# Patient Record
Sex: Male | Born: 1950 | Race: Black or African American | Hispanic: No | Smoking: Never smoker
Health system: Southern US, Community
[De-identification: ages and names within clinical notes are randomized; demographics above are authoritative.]

## PROBLEM LIST (undated history)

## (undated) DIAGNOSIS — I2699 Other pulmonary embolism without acute cor pulmonale: Secondary | ICD-10-CM

## (undated) DIAGNOSIS — M109 Gout, unspecified: Secondary | ICD-10-CM

## (undated) DIAGNOSIS — I82409 Acute embolism and thrombosis of unspecified deep veins of unspecified lower extremity: Secondary | ICD-10-CM

## (undated) DIAGNOSIS — R569 Unspecified convulsions: Secondary | ICD-10-CM

## (undated) HISTORY — PX: NO PAST SURGERIES: SHX2092

---

## 2017-01-27 ENCOUNTER — Encounter (HOSPITAL_COMMUNITY): Payer: Self-pay | Admitting: *Deleted

## 2017-01-27 ENCOUNTER — Inpatient Hospital Stay (HOSPITAL_COMMUNITY)
Admission: EM | Admit: 2017-01-27 | Discharge: 2017-01-29 | DRG: 552 | Disposition: A | Discharge status: Home / Self Care | Payer: Medicare Other | Source: Other Acute Inpatient Hospital | Attending: Neurology | Admitting: Neurology

## 2017-01-27 DIAGNOSIS — G40909 Epilepsy, unspecified, not intractable, without status epilepticus: Secondary | ICD-10-CM | POA: Diagnosis not present

## 2017-01-27 DIAGNOSIS — R4702 Dysphasia: Secondary | ICD-10-CM | POA: Diagnosis present

## 2017-01-27 DIAGNOSIS — M50122 Cervical disc disorder at C5-C6 level with radiculopathy: Principal | ICD-10-CM | POA: Diagnosis present

## 2017-01-27 DIAGNOSIS — Z86711 Personal history of pulmonary embolism: Secondary | ICD-10-CM

## 2017-01-27 DIAGNOSIS — E669 Obesity, unspecified: Secondary | ICD-10-CM | POA: Diagnosis present

## 2017-01-27 DIAGNOSIS — Z86718 Personal history of other venous thrombosis and embolism: Secondary | ICD-10-CM

## 2017-01-27 DIAGNOSIS — G40409 Other generalized epilepsy and epileptic syndromes, not intractable, without status epilepticus: Secondary | ICD-10-CM | POA: Diagnosis present

## 2017-01-27 DIAGNOSIS — R4701 Aphasia: Secondary | ICD-10-CM | POA: Diagnosis present

## 2017-01-27 DIAGNOSIS — M109 Gout, unspecified: Secondary | ICD-10-CM | POA: Diagnosis present

## 2017-01-27 DIAGNOSIS — Z9282 Status post administration of tPA (rtPA) in a different facility within the last 24 hours prior to admission to current facility: Secondary | ICD-10-CM

## 2017-01-27 DIAGNOSIS — M5412 Radiculopathy, cervical region: Secondary | ICD-10-CM | POA: Diagnosis not present

## 2017-01-27 DIAGNOSIS — I639 Cerebral infarction, unspecified: Secondary | ICD-10-CM | POA: Diagnosis not present

## 2017-01-27 DIAGNOSIS — R51 Headache: Secondary | ICD-10-CM | POA: Diagnosis present

## 2017-01-27 DIAGNOSIS — Z6831 Body mass index (BMI) 31.0-31.9, adult: Secondary | ICD-10-CM | POA: Diagnosis not present

## 2017-01-27 DIAGNOSIS — I5032 Chronic diastolic (congestive) heart failure: Secondary | ICD-10-CM | POA: Diagnosis not present

## 2017-01-27 HISTORY — DX: Unspecified convulsions: R56.9

## 2017-01-27 HISTORY — DX: Other pulmonary embolism without acute cor pulmonale: I26.99

## 2017-01-27 HISTORY — DX: Acute embolism and thrombosis of unspecified deep veins of unspecified lower extremity: I82.409

## 2017-01-27 HISTORY — DX: Gout, unspecified: M10.9

## 2017-01-27 LAB — MRSA PCR SCREENING: MRSA BY PCR: NEGATIVE

## 2017-01-27 MED ORDER — STROKE: EARLY STAGES OF RECOVERY BOOK
Freq: Once | Status: AC
  Administered 2017-01-27: 22:00:00
  Filled 2017-01-27: qty 1

## 2017-01-27 MED ORDER — ACETAMINOPHEN 325 MG PO TABS: 650.0000 mg | ORAL_TABLET | ORAL | Status: DC | PRN

## 2017-01-27 MED ORDER — PANTOPRAZOLE SODIUM 40 MG IV SOLR
40.0000 mg | Freq: Every day | INTRAVENOUS | Status: DC
  Administered 2017-01-27: 40 mg via INTRAVENOUS
  Filled 2017-01-27: qty 40

## 2017-01-27 MED ORDER — SODIUM CHLORIDE 0.9 % IV SOLN
INTRAVENOUS | Status: DC
  Administered 2017-01-27 – 2017-01-28 (×2): via INTRAVENOUS

## 2017-01-27 MED ORDER — LEVETIRACETAM 750 MG PO TABS
750.0000 mg | ORAL_TABLET | Freq: Every day | ORAL | Status: DC
  Administered 2017-01-27 – 2017-01-28 (×2): 750 mg via ORAL
  Filled 2017-01-27 (×2): qty 1

## 2017-01-27 MED ORDER — SENNOSIDES-DOCUSATE SODIUM 8.6-50 MG PO TABS: 1.0000 | ORAL_TABLET | Freq: Every evening | ORAL | Status: DC | PRN

## 2017-01-27 MED ORDER — CLEVIDIPINE BUTYRATE 0.5 MG/ML IV EMUL: 0.0000 mg/h | INTRAVENOUS | Status: DC

## 2017-01-27 MED ORDER — ACETAMINOPHEN 160 MG/5ML PO SOLN: 650.0000 mg | ORAL | Status: DC | PRN

## 2017-01-27 MED ORDER — LABETALOL HCL 5 MG/ML IV SOLN: 20.0000 mg | Freq: Once | INTRAVENOUS | Status: DC

## 2017-01-27 MED ORDER — ACETAMINOPHEN 650 MG RE SUPP: 650.0000 mg | RECTAL | Status: DC | PRN

## 2017-01-27 NOTE — H&P (Signed)
Admission H&P    Chief Complaint: Acute ischemic stroke, s/p IV tPA  HPI: Erik Peterson is an 66 y.o. male who was LKN at 1:45 PM today while eating lunch with a friend. He suddenly developed a cramping and mildly painful sensation in his left forearm that migrated up the medial aspect of his LUE to his shoulder and then to the left pectoral region. The abnormal sensation was accompanied by some weakness. Migration of the abnormal sensation continued across his chest to his right shoulder. In addition to the above, he experienced sudden onset of dysphasia, with inability to "get the words out". He also had a left sided headache at the time.   He was emergently evaluated by a Teleneurology consultant at [facility]. CT head was negative for hemorrhage. CTA showed no LVO. He was administered IV tPA and sent to [facility] for further management. Prior to transfer a CTA of chest was obtained for his chest pain, which ruled out a dissection.  The patient states that he has a history of seizures, but that the above episode was not a seizure. He states that he has "petit mal" seizures which occur only at night, about 15 times per year. He takes Keppra 750 mg qhs for this. He also takes ASA 81 mg daily. He has a history of gout and is prescribed colchicine for flares.   EKG here at [facility] shows sinus bradycardia and is otherwise normal.   LSN: Per outside hospital documentation tPA Given: Yes, at outside hospital  No past medical history on file.  No past surgical history on file.  No family history on file. Social History:  has no tobacco, alcohol, and drug history on file.  Allergies: Allergies not on file  Home Medications: Keppra 750 mg qhs ASA 81 mg qd Colchicine PRN  ROS: No chest pain, abdominal pain or lower extremity pain. States all neurological symptoms have completely resolved following tPA. Other ROS as per HPI.   Physical Examination: Blood pressure 116/88, pulse 62, temperature 98.2  F (36.8 C), resp. rate 14, height 5\' 7"  (1.702 m), weight 89.9 kg (198 lb 3.1 oz), SpO2 99 %.  HEENT-  [place]/AT  Lungs - Respirations unlabored Extremities - No edema  Neurologic Examination: Mental Status: Alert, oriented, thought content appropriate.  Speech fluent with intact naming, repetition and comprehension.  Able to follow 3 step commands without difficulty. Cranial Nerves: II:  Visual fields intact. PERRL.  III,IV, VI: ptosis not present, EOMI without nystagmus V,VII: smile symmetric, facial temp sensation normal bilaterally VIII: hearing intact to conversation IX,X: no hypophonia XI: symmetric XII: midline tongue extension  Motor: Right : Upper extremity   5/5    Left:     Upper extremity   5/5  Lower extremity   5/5     Lower extremity   5/5 Normal tone throughout; no atrophy noted Sensory: Temperature and light touch intact x 4. No extinction. Deep Tendon Reflexes:  1+ bilateral upper and lower extremities. Toes downgoing bilaterally.  Cerebellar: No ataxia with FNF bilaterally.  Gait: Deferred  No results found for this or any previous visit (from the past 48 hour(s)). No results found.  Assessment: 66 y.o. male with acute onset of expressive aphasia and LUE sensory symptoms 1. LUE sensory and motor symptoms are referable to the right MCA territory; however, the patient is right handed, which would make right hemisphere speech dominance unlikely and therefore calls into question the above localization with regard to his aphasia. Although stroke  was the working diagnosis at the outside hospital, a seizure or stress-related episode should also be considered.  2. Also presented with chest pain at OSH. CTA chest showed no dissection. EKG here shows no evidence for MI.  3. CTA at OSH showed no LVO.  4. Seizure disorder. On Keppra as an outpatient.   Plan: 1. HgbA1c, fasting lipid panel 2. MRI of the brain without contrast 3. PT consult, OT consult, Speech consult 4.  Echocardiogram 5. Carotid dopplers 6. No antiplatelet medications or anticoagulants for at least 24 hours following tPA. Can consider restarting ASA at a higher dose, versus switching to Plavix, if repeat CT head at 24 hours is negative for hemorrhage.  7. DVT prophylaxis with SCDs.  8. BP management as per post-tPA order set. 9. Consider starting a statin.  10. Telemetry monitoring   Electronically signed: Dr. Caryl PinaEric Danielly Peterson 01/27/2017, 7:43 PM

## 2017-01-28 ENCOUNTER — Inpatient Hospital Stay (HOSPITAL_COMMUNITY): Payer: Medicare Other

## 2017-01-28 ENCOUNTER — Encounter (HOSPITAL_COMMUNITY): Payer: Self-pay

## 2017-01-28 DIAGNOSIS — M5412 Radiculopathy, cervical region: Secondary | ICD-10-CM

## 2017-01-28 DIAGNOSIS — I5032 Chronic diastolic (congestive) heart failure: Secondary | ICD-10-CM

## 2017-01-28 DIAGNOSIS — G40909 Epilepsy, unspecified, not intractable, without status epilepticus: Secondary | ICD-10-CM

## 2017-01-28 LAB — ECHOCARDIOGRAM COMPLETE
AOASC: 34 cm
CHL CUP MV DEC (S): 190
E decel time: 190 msec
FS: 26 % — AB (ref 28–44)
Height: 67 in
IVS/LV PW RATIO, ED: 1.18
LA ID, A-P, ES: 37 mm
LA vol index: 20.7 mL/m2
LADIAMINDEX: 1.77 cm/m2
LAVOL: 43.3 mL
LAVOLA4C: 37.8 mL
LDCA: 3.46 cm2
LEFT ATRIUM END SYS DIAM: 37 mm
LV TDI E'MEDIAL: 7.62
LVELAT: 8.05 cm/s
LVOTD: 21 mm
Lateral S' vel: 11.1 cm/s
MV pk E vel: 1.2 m/s
PW: 11 mm — AB (ref 0.6–1.1)
TAPSE: 19.8 mm
TDI e' lateral: 8.05
Weight: 3171.1 oz

## 2017-01-28 LAB — CBC
HCT: 41.6 % (ref 39.0–52.0)
Hemoglobin: 13.6 g/dL (ref 13.0–17.0)
MCH: 30.6 pg (ref 26.0–34.0)
MCHC: 32.7 g/dL (ref 30.0–36.0)
MCV: 93.7 fL (ref 78.0–100.0)
PLATELETS: 154 10*3/uL (ref 150–400)
RBC: 4.44 MIL/uL (ref 4.22–5.81)
RDW: 13.3 % (ref 11.5–15.5)
WBC: 6.2 10*3/uL (ref 4.0–10.5)

## 2017-01-28 LAB — LIPID PANEL
CHOLESTEROL: 124 mg/dL (ref 0–200)
HDL: 41 mg/dL (ref >40)
LDL CALC: 77 mg/dL (ref 0–99)
TRIGLYCERIDES: 28 mg/dL (ref <150)
Total CHOL/HDL Ratio: 3 RATIO
VLDL: 6 mg/dL (ref 0–40)

## 2017-01-28 LAB — BASIC METABOLIC PANEL
Anion gap: 6 (ref 5–15)
BUN: 10 mg/dL (ref 6–20)
CALCIUM: 8.8 mg/dL — AB (ref 8.9–10.3)
CO2: 25 mmol/L (ref 22–32)
CREATININE: 1.04 mg/dL (ref 0.61–1.24)
Chloride: 109 mmol/L (ref 101–111)
Glucose, Bld: 112 mg/dL — ABNORMAL HIGH (ref 65–99)
Potassium: 4 mmol/L (ref 3.5–5.1)
Sodium: 140 mmol/L (ref 135–145)

## 2017-01-28 MED ORDER — PANTOPRAZOLE SODIUM 40 MG PO TBEC
40.0000 mg | DELAYED_RELEASE_TABLET | Freq: Every day | ORAL | Status: DC
  Filled 2017-01-28: qty 1

## 2017-01-28 MED ORDER — ASPIRIN EC 81 MG PO TBEC
81.0000 mg | DELAYED_RELEASE_TABLET | Freq: Every day | ORAL | Status: DC
  Administered 2017-01-28: 81 mg via ORAL
  Filled 2017-01-28: qty 1

## 2017-01-28 NOTE — Progress Notes (Signed)
STROKE TEAM PROGRESS NOTE   HISTORY OF PRESENT ILLNESS (per record)  Erik Peterson is an 66 y.o. male who was LKN at 1:45 PM today while eating lunch with a friend. He suddenly developed a cramping and mildly painful sensation in his left forearm that migrated up the medial aspect of his LUE to his shoulder and then to the left pectoral region. The abnormal sensation was accompanied by some weakness. Migration of the abnormal sensation continued across his chest to his right shoulder. In addition to the above, he experienced sudden onset of dysphasia, with inability to "get the words out". He also had a left sided headache at the time.   He was emergently evaluated by a Teleneurology consultant at [facility] CT head was negative for hemorrhage. CTA showed no LVO. He was administered IV tPA and [facility] management. Prior to transfer a CTA of chest was obtained for his chest pain, which ruled out a dissection.  The patient states that he has a history of seizures, but that the above episode was not a seizure. He states that he has "petit mal" seizures which occur only at night, about 15 times per year. He takes Keppra 750 mg qhs for this. He also takes ASA 81 mg daily. He has a history of gout and is prescribed colchicine for flares.   [facility] shows sinus bradycardia and is otherwise normal.   LSN: Per outside hospital documentation tPA Given: Yes, at outside hospital    SUBJECTIVE (INTERVAL HISTORY) No acute events overnight. Pt says that he has been having cramping of the arms for 2 months- more on the elft and the pain would last a long time, and continuous in nature. Yesterday he had left arm pain and it was more severe and radiated to his shoulder and face and then left chest and then right forearm at the time he was eating lunch. Then, he had word finding difficulty as he knew what he wanted to say but was not able to express it. He denies any history of  alcohol use or smoking. Received tPA at OSH and speech gradually improved. Currently, no speech problem and no cramping of arms. Admitted that he had intermittent neck discomfort and he had worked under the sink the day before yesterday and had stretched his neck a lot during the work.   OBJECTIVE Temp:  [97.7 F (36.5 C)-98.2 F (36.8 C)] 97.8 F (36.6 C) (08/15 1200) Pulse Rate:  [46-75] 52 (08/15 1500) Cardiac Rhythm: Sinus bradycardia (08/15 0800) Resp:  [12-24] 12 (08/15 1500) BP: (103-142)/(64-94) 122/80 (08/15 1500) SpO2:  [96 %-100 %] 100 % (08/15 1500) Weight:  [198 lb 3.1 oz (89.9 kg)] 198 lb 3.1 oz (89.9 kg) (08/14 1847)  CBC:   Recent Labs Lab 01/28/17 0730  WBC 6.2  HGB 13.6  HCT 41.6  MCV 93.7  PLT 154    Basic Metabolic Panel:   Recent Labs Lab 01/28/17 0730  NA 140  K 4.0  CL 109  CO2 25  GLUCOSE 112*  BUN 10  CREATININE 1.04  CALCIUM 8.8*    Lipid Panel:     Component Value Date/Time   CHOL 124 01/28/2017 0339   TRIG 28 01/28/2017 0339   HDL 41 01/28/2017 0339   CHOLHDL 3.0 01/28/2017 0339   VLDL 6 01/28/2017 0339   LDLCALC 77 01/28/2017 0339   HgbA1c: No results found for: HGBA1C Urine Drug Screen: No results found for: LABOPIA, COCAINSCRNUR,  LABBENZ, AMPHETMU, THCU, LABBARB  Alcohol Level No results found for: [facility]  IMAGING I have personally reviewed the radiological images below and agree with the radiology interpretations.  CTA head and neck  1. no emergent large vessel occlusion or high-grade stenosis. 2. normal internal carotid and vertebral arteries 3. normal appearance of the thoracic aorta to the level of the right pulmonary artery  CT head 1.No hemorrhage or mass lesion 2. findings of chronic microvascular ischemia 3. aspects is 10 4. unchanged in appearance of multifocal hyperdense material in the posterior skull this may indicate multiple sebaceous cysts or other chronic subcutaneous processes   MRI brain  1. No acute  intracranial abnormality. No MR evidence of recent seizure. 2. Moderate to severe sequelae of chronic microvascular ischemia.   MRI C-spine pending  TTE pending   PHYSICAL EXAM  Temp:  [97.7 F (36.5 C)-98.2 F (36.8 C)] 97.8 F (36.6 C) (08/15 1200) Pulse Rate:  [46-75] 52 (08/15 1500) Resp:  [12-24] 12 (08/15 1500) BP: (103-142)/(64-94) 122/80 (08/15 1500) SpO2:  [96 %-100 %] 100 % (08/15 1500) Weight:  [198 lb 3.1 oz (89.9 kg)] 198 lb 3.1 oz (89.9 kg) (08/14 1847)  General - Well nourished, well developed, in no apparent distress.  Ophthalmologic - Sharp disc margins OU.   Cardiovascular - Regular rate and rhythm.  Mental Status -  Level of arousal and orientation to time, place, and person were intact. Language including expression, naming, repetition, comprehension was assessed and found intact. Fund of Knowledge was assessed and was intact.  Cranial Nerves II - XII - II - Visual field intact OU. III, IV, VI - Extraocular movements intact. V - Facial sensation intact bilaterally. VII - Facial movement intact bilaterally. VIII - Hearing & vestibular intact bilaterally. X - Palate elevates symmetrically. XI - Chin turning & shoulder shrug intact bilaterally. XII - Tongue protrusion intact.  Motor Strength - The patient's strength was normal in all extremities and pronator drift was absent.  Bulk was normal and fasciculations were absent.   Motor Tone - Muscle tone was assessed at the neck and appendages and was normal.  Reflexes - The patient's reflexes were 1+ in all extremities and he had no pathological reflexes.  Sensory - Light touch, temperature/pinprick, vibration and proprioception were assessed and were symmetrical.    Coordination - The patient had normal movements in the hands and feet with no ataxia or dysmetria.  Tremor was absent.  Gait and Station - deferred.   ASSESSMENT/PLAN Erik Peterson is a 66 y.o. male with history of seizure disorder  presenting from OSH with acute ischemic stroke s/p TPA.  TIA vs. cervical radiculopathy - s/p TPA in [facility].  patient present patient not typical for stroke or TIA, but more concerning for cervical radiculopathy. Patient has no significant stroke risk factors   Resultant  Arm cramping improved   CT hno acute abnormality  MRI hno acute stroke   MR cervical spine- pending  2D Echo  pending  LDL 77  HgbA1c pending  SCDs  for VTE prophylaxis Diet Heart Room service appropriate? Yes; Fluid consistency: Thin  aspirin 81 mg daily prior to admission, now on aspirin 81mg  daily 24 hours after TPA   Ongoing aggressive stroke risk factor management  Therapy recommendations:  pending  Disposition:  pending  BP management  Stable  BP <  180/105 post TPA   Long-term BP goal normotensive  Other Stroke Risk Factors  Advanced age  Obesity, Body mass  index is 31.04 kg/m., recommend weight loss, diet and exercise as appropriate   Other Active Problems  Seizure disorder- on keppra Qhs   Hospital day # 1  This patient is critically ill due to strokelike symptoms status post TPA and at significant risk of neurological worsening, death form hemorrhage from TPA. This patient's care requires constant monitoring of vital signs, hemodynamics, respiratory and cardiac monitoring, review of multiple databases, neurological assessment, discussion with family, other specialists and medical decision making of high complexity. I spent 35 minutes of neurocritical care time in the care of this patient.  Marvel Plan, MD PhD Stroke Neurology 01/28/2017 4:42 PM  To contact Stroke Continuity provider, please refer to [email or web address]. After hours, contact General Neurology

## 2017-01-28 NOTE — Progress Notes (Signed)
OT Cancellation Note  Patient Details Name: Erik FinlayDanny Peterson MRN: [id number] DOB: 1950-10-07   Cancelled Treatment:    Reason Eval/Treat Not Completed: Patient not medically ready. Pt with active bedrest orders. Will await increase in activity orders prior to initiating OT evaluation. Thank you for this referral!  Doristine Sectionharity A Nalee Lightle, MS OTR/L  Pager: [phone]   Zair Borawski A Kallan Bischoff 01/28/2017, 7:21 AM

## 2017-01-28 NOTE — Plan of Care (Signed)
Problem: Coping: Goal: Ability to verbalize positive feelings about self will improve Outcome: Completed/Met Date Met: 01/28/17 Pt expresses positive thoughts on situation and self, goal met. Goal: Ability to identify appropriate support needs will improve Outcome: Completed/Met Date Met: 01/28/17 Pt able to identify support system and able to communicate needs, goal met.  Problem: Nutrition: Goal: Risk of aspiration will decrease Outcome: Completed/Met Date Met: 01/28/17 Pt passed swallow screen, able to control secretions, denies any difficulties swallowing. Goal: Dietary intake will improve Outcome: Completed/Met Date Met: 01/28/17 Pt has good appetite, consumes adequate portion of meal tray, goal met.

## 2017-01-28 NOTE — Progress Notes (Signed)
SLP Cancellation Note  Patient Details Name: Erik FinlayDanny Peterson MRN: [id number] DOB: 1950/11/09   Cancelled treatment:       Reason Eval/Treat Not Completed: Patient at procedure or test/unavailable   Alyxander Kollmann, Riley NearingBonnie Caroline 01/28/2017, 2:37 PM

## 2017-01-28 NOTE — Progress Notes (Signed)
  Echocardiogram 2D Echocardiogram has been performed.  Erik Peterson [name] 01/28/2017, 3:07 PM

## 2017-01-28 NOTE — Progress Notes (Signed)
PT Cancellation Note  Patient Details Name: Erik FinlayDanny Colleran MRN: [id number] DOB: 12-Apr-1951   Cancelled Treatment:    Reason Eval/Treat Not Completed: Patient not medically ready. Pt with active bedrest orders. Will await increase in activity orders prior to initiating PT evaluation.    Marylynn PearsonLaura D Abe Schools 01/28/2017, 7:30 AM   Conni SlipperLaura Bakary Bramer, PT, DPT Acute Rehabilitation Services Pager: [phone]

## 2017-01-28 NOTE — Progress Notes (Signed)
Pt received from 4N with no noted distress. Pt stable, neuro intact. Telemetry monitoring. Pt oriented to room. Safety measures in place. Call bell within reach. Will continue to monitor.

## 2017-01-29 LAB — CBC
HEMATOCRIT: 41.9 % (ref 39.0–52.0)
HEMOGLOBIN: 13.8 g/dL (ref 13.0–17.0)
MCH: 30.9 pg (ref 26.0–34.0)
MCHC: 32.9 g/dL (ref 30.0–36.0)
MCV: 93.7 fL (ref 78.0–100.0)
Platelets: 144 10*3/uL — ABNORMAL LOW (ref 150–400)
RBC: 4.47 MIL/uL (ref 4.22–5.81)
RDW: 13.3 % (ref 11.5–15.5)
WBC: 5.8 10*3/uL (ref 4.0–10.5)

## 2017-01-29 LAB — HEMOGLOBIN A1C
Hgb A1c MFr Bld: 6.3 % — ABNORMAL HIGH (ref 4.8–5.6)
Mean Plasma Glucose: 134 mg/dL

## 2017-01-29 LAB — BASIC METABOLIC PANEL
ANION GAP: 4 — AB (ref 5–15)
BUN: 11 mg/dL (ref 6–20)
CALCIUM: 8.9 mg/dL (ref 8.9–10.3)
CHLORIDE: 109 mmol/L (ref 101–111)
CO2: 27 mmol/L (ref 22–32)
Creatinine, Ser: 1.09 mg/dL (ref 0.61–1.24)
GFR calc non Af Amer: >60 mL/min (ref >60)
Glucose, Bld: 101 mg/dL — ABNORMAL HIGH (ref 65–99)
Potassium: 4.1 mmol/L (ref 3.5–5.1)
Sodium: 140 mmol/L (ref 135–145)

## 2017-01-29 NOTE — Care Management Note (Signed)
Case Management Note  Patient Details  Name: Erik Peterson MRN: [id number] Date of Birth: 1950/11/27  Subjective/Objective:                    Action/Plan: Pt discharging home with self care. Pt has PCP [facility]ull[facility] and insurance. Family to provide transportation home. No further needs per CM.   Expected Discharge Date:  01/29/17               Expected Discharge Plan:  Home/Self Care  In-House Referral:     Discharge planning Services     Post Acute Care Choice:    Choice offered to:     DME Arranged:    DME Agency:     HH Arranged:    HH Agency:     Status of Service:  Completed, signed off  If discussed at [employer] Length of Stay Meetings, dates discussed:    Additional Comments:  Kermit BaloKelli F Beverlyann Broxterman, RN 01/29/2017, 12:53 PM

## 2017-01-29 NOTE — Evaluation (Signed)
Physical Therapy Evaluation and Discharge Patient Details Name: Hoy FinlayDanny Geiman MRN: [id number] DOB: 1951/05/03 Today's Date: 01/29/2017   History of Present Illness  Pt is a 66 y/o male admitted secondary to L UE sensory symptoms and aphasia. tPA was administered. CT and MRI were negative for any acute findings. No pertinent PMH on file.  Clinical Impression  Pt presented sitting OOB in recliner chair, awake and willing to participate in therapy session. Prior to admission, pt reported that he was independent with all functional mobility and ADLs. Pt is retired and reports that he performs all housework and yard work independently. Pt ambulated in hallway with supervision without use of an AD. Pt also successfully completed stair training with no concerns. No further acute PT needs identified at this time. PT signing off.     Follow Up Recommendations No PT follow up    Equipment Recommendations  None recommended by PT    Recommendations for Other Services       Precautions / Restrictions Precautions Precautions: None Restrictions Weight Bearing Restrictions: No      Mobility  Bed Mobility               General bed mobility comments: pt sitting OOB in recliner chair  Transfers Overall transfer level: Independent                  Ambulation/Gait Ambulation/Gait assistance: Supervision Ambulation Distance (Feet): 200 Feet Assistive device: None Gait Pattern/deviations: WFL(Within Functional Limits) Gait velocity: WFL Gait velocity interpretation: at or above normal speed for age/gender General Gait Details: no instability or LOB, supervision for safety  Stairs Stairs: Yes   Stair Management: One rail Right;Alternating pattern;Forwards Number of Stairs: 4    Wheelchair Mobility    Modified Rankin (Stroke Patients Only)       Balance Overall balance assessment: No apparent balance deficits (not formally assessed)                                           Pertinent Vitals/Pain Pain Assessment: No/denies pain    Home Living Family/patient expects to be discharged to:: Private residence Living Arrangements: Alone Available Help at Discharge: Family;Friend(s);Available PRN/intermittently Type of Home: House Home Access: Level entry     Home Layout: One level Home Equipment: None      Prior Function Level of Independence: Independent               Hand Dominance   Dominant Hand: Right    Extremity/Trunk Assessment   Upper Extremity Assessment Upper Extremity Assessment: Overall WFL for tasks assessed    Lower Extremity Assessment Lower Extremity Assessment: Overall WFL for tasks assessed       Communication   Communication: No difficulties  Cognition Arousal/Alertness: Awake/alert Behavior During Therapy: WFL for tasks assessed/performed Overall Cognitive Status: Within Functional Limits for tasks assessed                                        General Comments      Exercises     Assessment/Plan    PT Assessment Patent does not need any further PT services  PT Problem List         PT Treatment Interventions      PT Goals (Current goals can be found  in the Care Plan section)  Acute Rehab PT Goals Patient Stated Goal: return home    Frequency     Barriers to discharge        Co-evaluation               AM-PAC PT "6 Clicks" Daily Activity  Outcome Measure Difficulty turning over in bed (including adjusting bedclothes, sheets and blankets)?: None Difficulty moving from lying on back to sitting on the side of the bed? : None Difficulty sitting down on and standing up from a chair with arms (e.g., wheelchair, bedside commode, etc,.)?: None Help needed moving to and from a bed to chair (including a wheelchair)?: None Help needed walking in hospital room?: None Help needed climbing 3-5 steps with a railing? : A Little 6 Click Score: 23    End of  Session   Activity Tolerance: Patient tolerated treatment well Patient left: in chair;with call bell/phone within reach Nurse Communication: Mobility status PT Visit Diagnosis: Other symptoms and signs involving the nervous system (R29.898)    Time: 2841-3244 PT Time Calculation (min) (ACUTE ONLY): 18 min   Charges:   PT Evaluation $PT Eval High Complexity: 1 High     PT G Codes:        [place], PT, DPT [phone]   Alessandra Bevels Tecla Mailloux 01/29/2017, 10:14 AM

## 2017-01-29 NOTE — Discharge Summary (Signed)
Stroke Discharge Summary  Patient ID: Gilverto Dileonardo   MRN: [id number]      DOB: [birth year]  Date of Admission: 01/27/2017 Date of Discharge: 01/29/2017  Attending Physician:  Marvel Plan, MD, Stroke MD Consultant(s):    none Patient's PCP:  System, Pcp Not In  DISCHARGE DIAGNOSIS: Principal Problem:   cervical radiculopathy   Seizure disorder on keppra   Past Medical History:  Diagnosis Date  . DVT (deep venous thrombosis) (HCC)   . Gout   . Pulmonary embolism (HCC)   . Seizures (HCC)    Past Surgical History:  Procedure Laterality Date  . NO PAST SURGERIES      Allergies as of 01/29/2017   No Known Allergies     Medication List    TAKE these medications   aspirin EC 81 MG tablet Take 81 mg by mouth at bedtime.   levETIRAcetam 750 MG tablet Commonly known as:  KEPPRA Take 750 mg by mouth at bedtime.       LABORATORY STUDIES CBC    Component Value Date/Time   WBC 5.8 01/29/2017 0448   RBC 4.47 01/29/2017 0448   HGB 13.8 01/29/2017 0448   HCT 41.9 01/29/2017 0448   PLT 144 (L) 01/29/2017 0448   MCV 93.7 01/29/2017 0448   MCH 30.9 01/29/2017 0448   MCHC 32.9 01/29/2017 0448   RDW 13.3 01/29/2017 0448   CMP    Component Value Date/Time   NA 140 01/29/2017 0448   K 4.1 01/29/2017 0448   CL 109 01/29/2017 0448   CO2 27 01/29/2017 0448   GLUCOSE 101 (H) 01/29/2017 0448   BUN 11 01/29/2017 0448   CREATININE 1.09 01/29/2017 0448   CALCIUM 8.9 01/29/2017 0448   GFRNONAA >60 01/29/2017 0448   GFRAA >60 01/29/2017 0448   COAGSNo results found for: INR, PROTIME Lipid Panel    Component Value Date/Time   CHOL 124 01/28/2017 0339   TRIG 28 01/28/2017 0339   HDL 41 01/28/2017 0339   CHOLHDL 3.0 01/28/2017 0339   VLDL 6 01/28/2017 0339   LDLCALC 77 01/28/2017 0339   HgbA1C  Lab Results  Component Value Date   HGBA1C 6.3 (H) 01/28/2017   Urinalysis No results found for: COLORURINE, APPEARANCEUR, LABSPEC, PHURINE, GLUCOSEU, HGBUR, BILIRUBINUR,  KETONESUR, PROTEINUR, UROBILINOGEN, NITRITE, LEUKOCYTESUR Urine Drug Screen No results found for: LABOPIA, COCAINSCRNUR, LABBENZ, AMPHETMU, THCU, LABBARB  Alcohol Level No results found for: [facility]   SIGNIFICANT DIAGNOSTIC STUDIES I have personally reviewed the radiological images below and agree with the radiology interpretations.  CTA head and neck  1. no emergent large vessel occlusion or high-grade stenosis. 2. normal internal carotid and vertebral arteries 3. normal appearance of the thoracic aorta to the level of the right pulmonary artery  CT head 1.No hemorrhage or mass lesion 2. findings of chronic microvascular ischemia 3. aspects is 10 4. unchanged in appearance of multifocal hyperdense material in the posterior skull this may indicate multiple sebaceous cysts or other chronic subcutaneous processes   MRI brain  1. No acute intracranial abnormality. No MR evidence of recent seizure. 2. Moderate to severe sequelae of chronic microvascular ischemia.  MRI C-spine pending 01/28/2017 IMPRESSION: 1. No acute findings or clear explanation for the patient's symptoms. Fine detail degraded by motion artifact. 2. Multilevel spondylosis, similar to previous CT. There is moderate spinal stenosis at C5-6 and C6-7 due to disc bulging and posterior osteophytes. At both levels, there is mild cord flattening, but no abnormal cord  signal. At both levels, there is foraminal narrowing, worse on the right at C5-6 and on left at C6-7. 3. Small disc protrusion in the right subarticular zone at C4-5.  TTE pending 01/28/2017 Study Conclusions - Left ventricle: The cavity size was normal. Wall thickness was   increased in a pattern of mild LVH. The estimated ejection   fraction was 50%. Diffuse hypokinesis. Features are consistent   with a pseudonormal left ventricular filling pattern, with   concomitant abnormal relaxation and increased filling pressure   (grade 2 diastolic  dysfunction). - Aortic valve: There was no stenosis. - Mitral valve: There was no significant regurgitation. - Right ventricle: The cavity size was normal. Systolic function   was normal. - Pulmonary arteries: No complete TR doppler jet so unable to   estimate PA systolic pressure. - Inferior vena cava: The vessel was normal in size. The   respirophasic diameter changes were in the normal range (>= 50%),   consistent with normal central venous pressure. Impressions: - Normal LV size with mild LV hypertrophy. EF 50%, mild diffuse   hypokinesis. Moderate diastolic dysfunction. Normal RV size and   systolic function. No significant valvular abnormalities.    HISTORY OF PRESENT ILLNESS Imri Lor is an 66 y.o. male who was LKN at 1:45 PM today while eating lunch with a friend. He suddenly developed a cramping and mildly painful sensation in his left forearm that migrated up the medial aspect of his LUE to his shoulder and then to the left pectoral region. The abnormal sensation was accompanied by some weakness. Migration of the abnormal sensation continued across his chest to his right shoulder. In addition to the above, he experienced sudden onset of dysphasia, with inability to "get the words out". He also had a left sided headache at the time.   He was emergently evaluated by a Teleneurology consultant at [facility]. CT head was negative for hemorrhage. CTA showed no LVO. He was administered IV tPA and sent to [facility] for further management. Prior to transfer a CTA of chest was obtained for his chest pain, which ruled out a dissection.  The patient states that he has a history of seizures, but that the above episode was not a seizure. He states that he has "petit mal" seizures which occur only at night, about 15 times per year. He takes Keppra 750 mg qhs for this. He also takes ASA 81 mg daily. He has a history of gout and is prescribed colchicine for flares.   EKG here at [facility] shows  sinus bradycardia and is otherwise normal.   LSN: Per outside hospital documentation tPA Given: Yes, at outside hospital  No past medical history on file.  No past surgical history on file.  No family history on file. Social History:  has no tobacco, alcohol, and drug history on file.  Allergies: Allergies not on file  HOSPITAL COURSE Mr. Jamai Dolce is a 66 y.o. male with history of seizure disorder presenting from OSH with acute ischemic stroke s/p TPA.  cervical radiculopathy - s/p TPA in [place] hospital.  patient present patient not typical for stroke or TIA, but more concerning for cervical radiculopathy. 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 No source of embolus  LDL 77  HgbA1c 6.3  SCDs  for VTE prophylaxis  Diet Heart Room service appropriate? Yes; Fluid consistency: Thin  aspirin 81 mg daily prior to admission, now on aspirin 81mg  daily  Ongoing aggressive stroke risk factor management  Therapy recommendations:  pending  Disposition:  pending  BP management  Stable  BP <  180/105 post TPA   Long-term BP goal normotensive  Other Stroke Risk Factors  Advanced age  Obesity, Body mass index is 31.04 kg/m., recommend weight loss, diet and exercise as appropriate   Other Active Problems  Seizure disorder- on keppra Qhs   DISCHARGE EXAM Blood pressure 120/73, pulse 60, temperature 97.6 F (36.4 C), temperature source Oral, resp. rate 20, height 5\' 7"  (1.702 m), weight 89.9 kg (198 lb 3.1 oz), SpO2 100 %.  General - Well nourished, well developed, in no apparent distress.  Ophthalmologic - Sharp disc margins OU.   Cardiovascular - Regular rate and rhythm.  Mental Status -  Level of arousal and orientation to time, place, and person were  intact. Language including expression, naming, repetition, comprehension was assessed and found intact. Fund of Knowledge was assessed and was intact.  Cranial Nerves II - XII - II - Visual field intact OU. III, IV, VI - Extraocular movements intact. V - Facial sensation intact bilaterally. VII - Facial movement intact bilaterally. VIII - Hearing & vestibular intact bilaterally. X - Palate elevates symmetrically. XI - Chin turning & shoulder shrug intact bilaterally. XII - Tongue protrusion intact.  Motor Strength - The patient's strength was normal in all extremities and pronator drift was absent.  Bulk was normal and fasciculations were absent.   Motor Tone - Muscle tone was assessed at the neck and appendages and was normal.  Reflexes - The patient's reflexes were 1+ in all extremities and he had no pathological reflexes.  Sensory - Light touch, temperature/pinprick, vibration and proprioception were assessed and were symmetrical.    Coordination - The patient had normal movements in the hands and feet with no ataxia or dysmetria.  Tremor was absent.  Gait and Station - deferred.   Discharge Diet   Diet Heart Room service appropriate? Yes; Fluid consistency: Thin liquids  DISCHARGE PLAN  Disposition: Discharge to home.  aspirin 81 mg daily for secondary stroke prevention.  Ongoing risk factor control by Primary Care Physician at time of discharge  Follow-up System, Pcp Not In in 2 weeks.  35 minutes were spent preparing discharge.  Marvel PlanJindong Torion Hulgan, MD PhD Stroke Neurology 01/29/2017 10:27 PM

## 2017-01-29 NOTE — Evaluation (Signed)
SLP Cancellation Note  Patient Details Name: Erik Peterson MRN: [id number] DOB: March 31, 1951   Cancelled treatment:       Reason Eval/Treat Not Completed: SLP screened, no needs identified, will sign off;Other (comment) (pt politely declines to participte in evaluation, states short term expressive language deficit resolved)  Erik Burnetamara Rebacca [place]ot[place], MS [facility] SLP [phone]  Erik Peterson, Erik Peterson 01/29/2017, 8:48 AM

## 2017-01-29 NOTE — Evaluation (Signed)
Occupational Therapy Evaluation and Discharge Patient Details Name: Erik Peterson MRN: [id number] DOB: 1950-12-05 Today's Date: 01/29/2017    History of Present Illness Pt is a 66 y/o male admitted secondary to L UE sensory symptoms and aphasia. tPA was administered. CT and MRI were negative for any acute findings. No pertinent PMH on file.   Clinical Impression   PTA Pt independent in ADL and mobility. Pt very active and is back at baseline. Pt able to demonstrate safety and independence in ADL and pass vision assessment with no deficits. No further OT concerns at this time. Pt denies sensory changes in UE at this time as well. OT to sign off, thank you for the opportunity to serve this patient.     Follow Up Recommendations  No OT follow up    Equipment Recommendations  None recommended by OT    Recommendations for Other Services       Precautions / Restrictions Precautions Precautions: None Restrictions Weight Bearing Restrictions: No      Mobility Bed Mobility               General bed mobility comments: pt sitting OOB in recliner chair when OT entered  Transfers Overall transfer level: Independent                    Balance Overall balance assessment: No apparent balance deficits (not formally assessed)                                         ADL either performed or assessed with clinical judgement   ADL Overall ADL's : Independent                                             Vision Patient Visual Report: No change from baseline Vision Assessment?: Yes Eye Alignment: Within Functional Limits Ocular Range of Motion: Within Functional Limits Alignment/Gaze Preference: Within Defined Limits Tracking/Visual Pursuits: Able to track stimulus in all quads without difficulty Saccades: Within functional limits Convergence: Within functional limits Visual Fields: No apparent deficits Additional Comments: Able to read  from book and find objects around the room     Perception     Praxis      Pertinent Vitals/Pain Pain Assessment: No/denies pain     Hand Dominance Right   Extremity/Trunk Assessment Upper Extremity Assessment Upper Extremity Assessment: Overall WFL for tasks assessed   Lower Extremity Assessment Lower Extremity Assessment: Overall WFL for tasks assessed   Cervical / Trunk Assessment Cervical / Trunk Assessment: Normal   Communication Communication Communication: No difficulties   Cognition Arousal/Alertness: Awake/alert Behavior During Therapy: WFL for tasks assessed/performed Overall Cognitive Status: Within Functional Limits for tasks assessed                                     General Comments  Pt's brother present for session    Exercises     Shoulder Instructions      Home Living Family/patient expects to be discharged to:: Private residence Living Arrangements: Alone Available Help at Discharge: Family;Friend(s);Available PRN/intermittently Type of Home: House Home Access: Level entry     Home Layout: One level     Bathroom  Shower/Tub: Chief Strategy Officer: Standard     Home Equipment: None          Prior Functioning/Environment Level of Independence: Independent                 OT Problem List:        OT Treatment/Interventions:      OT Goals(Current goals can be found in the care plan section) Acute Rehab OT Goals Patient Stated Goal: return home OT Goal Formulation: With patient/family Time For Goal Achievement: 02/12/17 Potential to Achieve Goals: Good  OT Frequency:     Barriers to D/C:            Co-evaluation              AM-PAC PT "6 Clicks" Daily Activity     Outcome Measure Help from another person eating meals?: None Help from another person taking care of personal grooming?: None Help from another person toileting, which includes using toliet, bedpan, or urinal?: None Help  from another person bathing (including washing, rinsing, drying)?: None Help from another person to put on and taking off regular upper body clothing?: None Help from another person to put on and taking off regular lower body clothing?: None 6 Click Score: 24   End of Session Nurse Communication: Mobility status  Activity Tolerance: Patient tolerated treatment well Patient left: in chair;with call bell/phone within reach;with family/visitor present  OT Visit Diagnosis: Other symptoms and signs involving the nervous system (R29.898)                Time: 1059-1110 OT Time Calculation (min): 11 min Charges:  OT General Charges $OT Visit: 1 Procedure OT Evaluation $OT Eval Low Complexity: 1 Procedure G-Codes:     Sherryl Manges OTR/L [phone]  Erik Peterson 01/29/2017, 11:13 AM

## 2017-03-16 ENCOUNTER — Ambulatory Visit: Payer: Medicare Other | Admitting: Neurology

## 2017-05-05 ENCOUNTER — Other Ambulatory Visit: Payer: Self-pay

## 2017-05-05 NOTE — Patient Outreach (Signed)
Telephone outreach to patient to obtain mRS was successfully completed. mRS = 0 

## 2018-03-10 IMAGING — MR MR HEAD W/O CM
10 of 11 series · 36 of 48 positions shown · non-contrast
Comparison: Head CT 10/30/2015

CLINICAL DATA: Acute stroke.  Status post tPA administration.

EXAM:
MRI HEAD WITHOUT CONTRAST
TECHNIQUE: Multiplanar, multiecho pulse sequences of the brain and surrounding
structures were obtained without intravenous contrast.

[Series 3: DWI · axial · 3.0mm · 0.94mm/px · z∈[-136,+8]mm · 8 of 100 slices shown (1 of 2)]
[im 1/100]
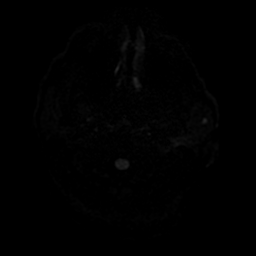
[im 12/100]
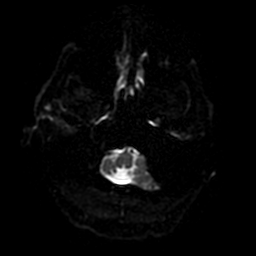
[im 34/100]
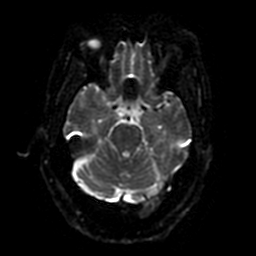
[im 45/100]
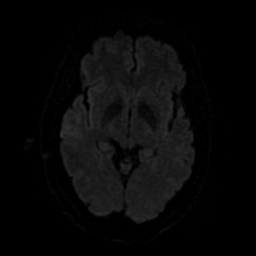
[im 56/100]
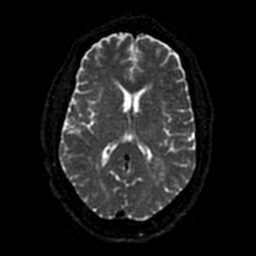
[im 67/100]
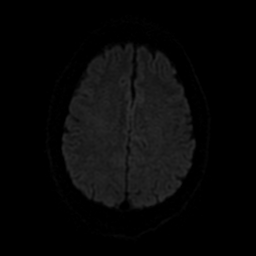
[im 89/100]
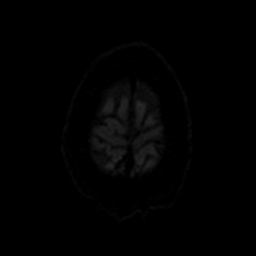
[im 100/100]
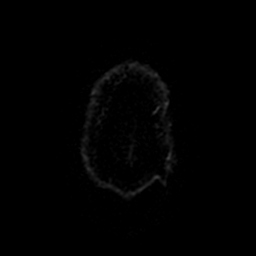

[Series 4: DWI · coronal · 4.0mm · 0.94mm/px · 7 of 72 slices shown (2 of 2)]
[im 1/72]
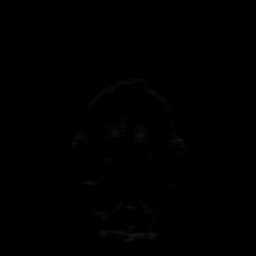
[im 12/72]
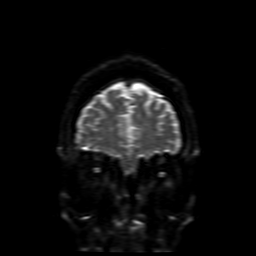
[im 24/72]
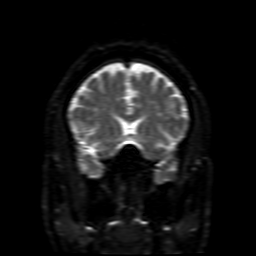
[im 36/72]
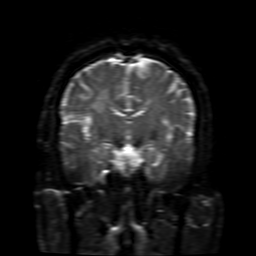
[im 48/72]
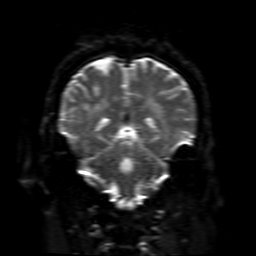
[im 60/72]
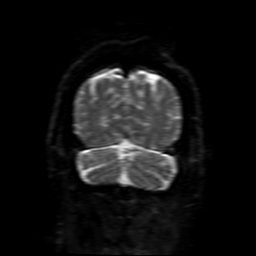
[im 72/72]
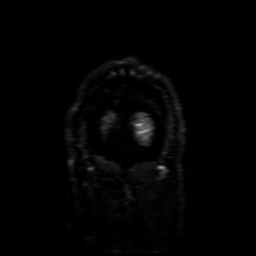

[Series 5: FLAIR · sagittal · 5.0mm · 0.47mm/px · 2 of 23 slices shown (1 of 2)]
[im 1/23]
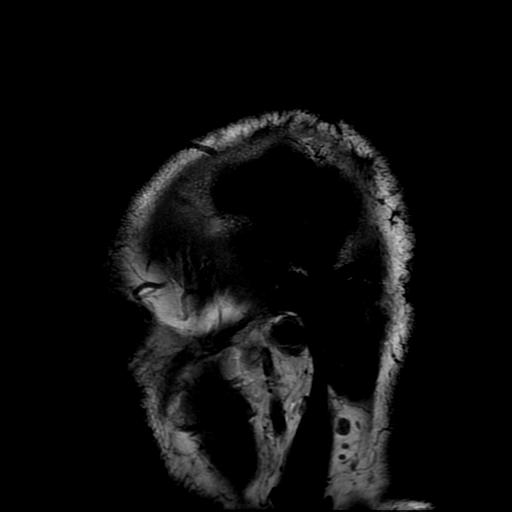
[im 23/23]
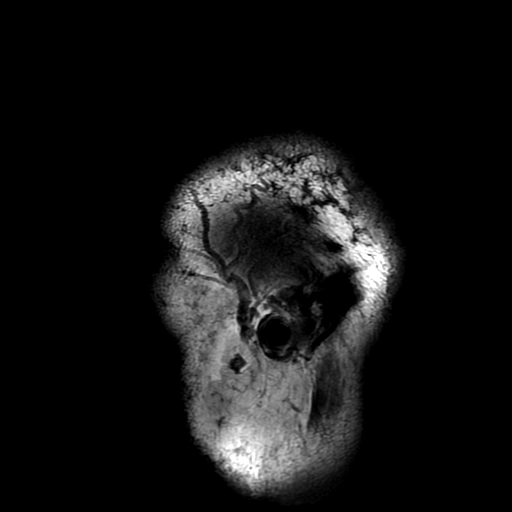

[Series 6: T2 · axial · 5.0mm · 0.47mm/px · z∈[-134,+6]mm · 2 of 25 slices shown (1 of 3)]
[im 1/25]
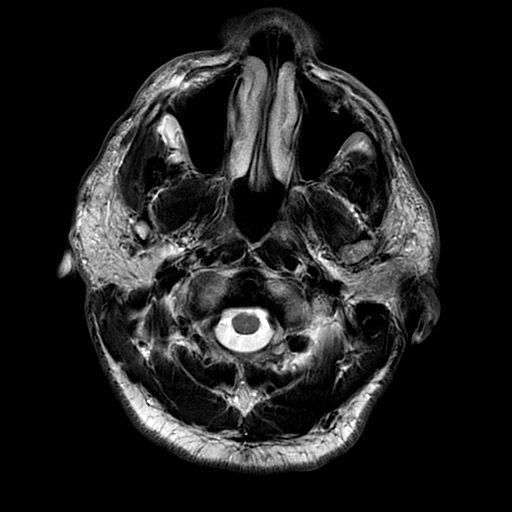
[im 25/25]
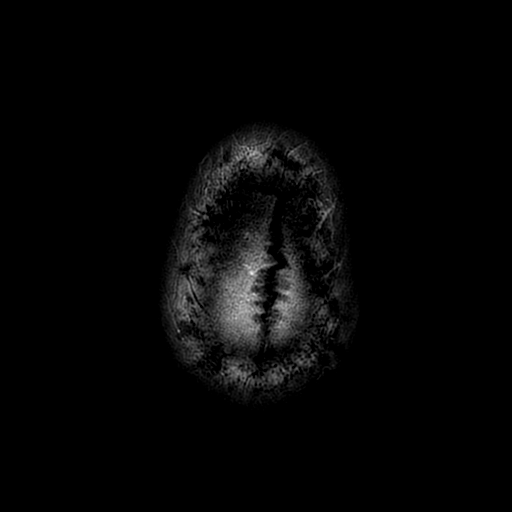

[Series 7: FLAIR · axial · 5.0mm · 0.47mm/px · z∈[-134,+6]mm · 2 of 25 slices shown (2 of 2)]
[im 1/25]
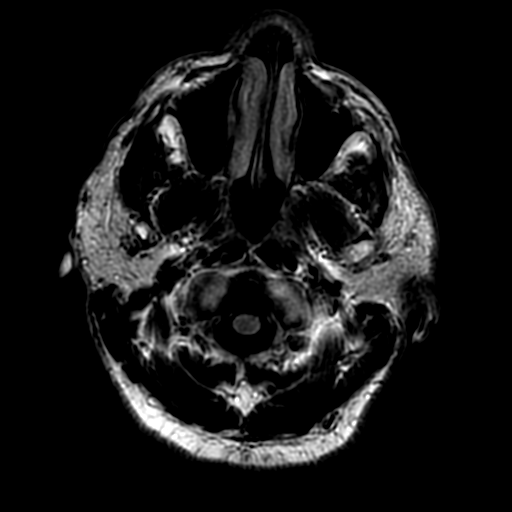
[im 25/25]
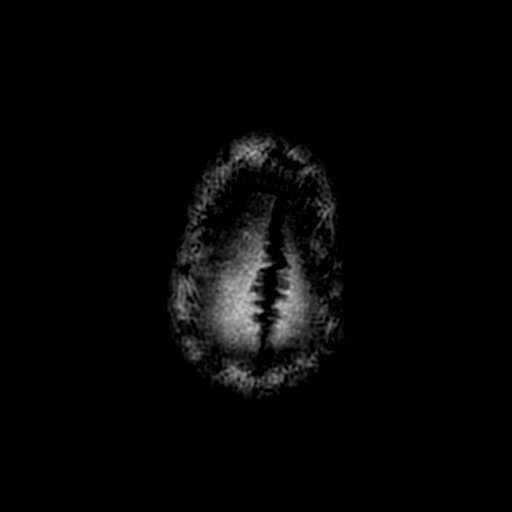

[Series 8: (person_name) · axial · 3.0mm · 0.47mm/px · z∈[-136,-101]mm · 3 of 100 slices shown]
[im 1/100]
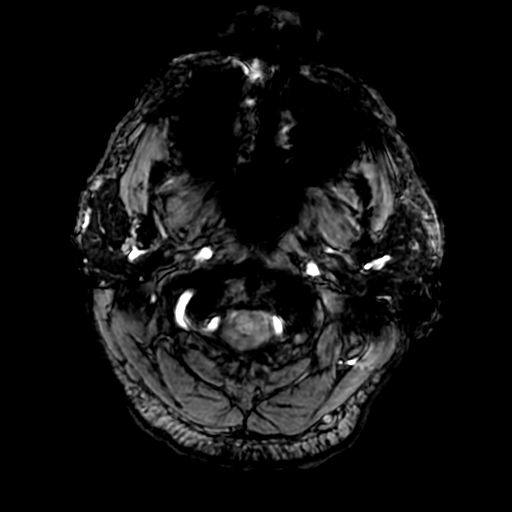
[im 13/100]
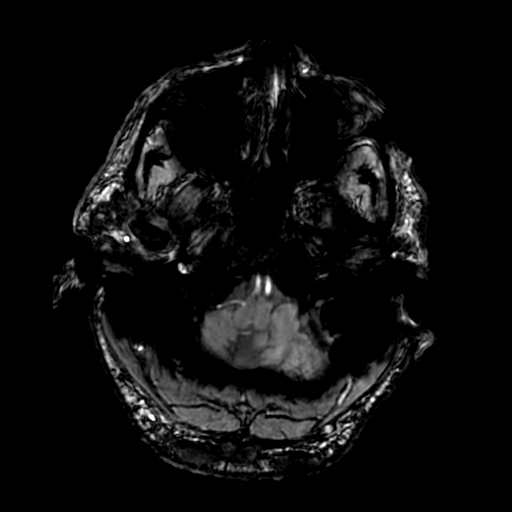
[im 25/100]
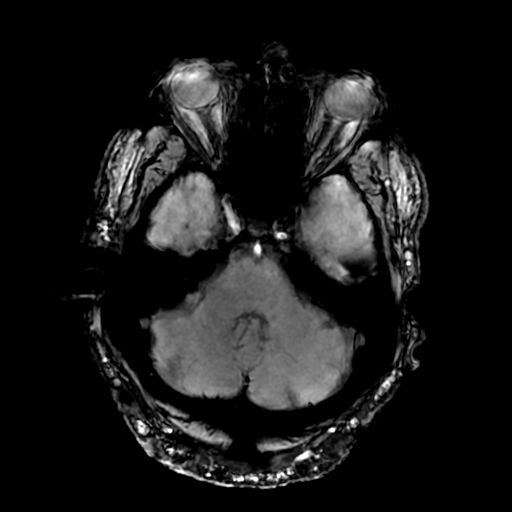

[Series 10: T2 · coronal · 5.0mm · 0.94mm/px · 3 of 30 slices shown (2 of 3)]
[im 1/30]
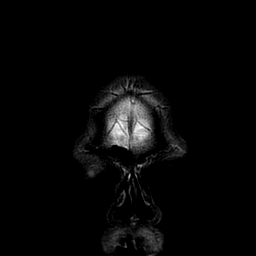
[im 15/30]
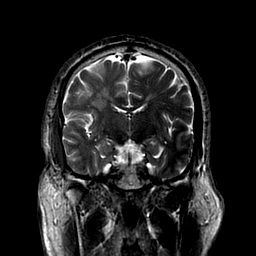
[im 30/30]
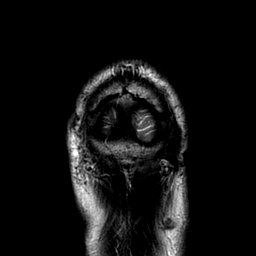

[Series 11: T2 · coronal · 3.5mm · 0.35mm/px · 2 of 23 slices shown (3 of 3)]
[im 1/23]
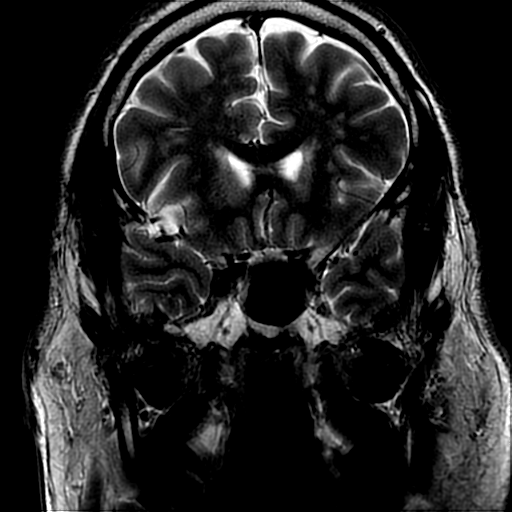
[im 23/23]
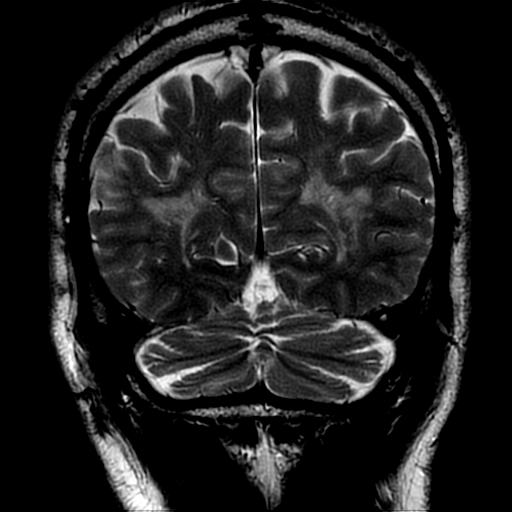

[Series 350: ADC · axial · 3.0mm · 0.94mm/px · z∈[-136,+8]mm · 4 of 49 slices shown (1 of 2)]
[im 1/49]
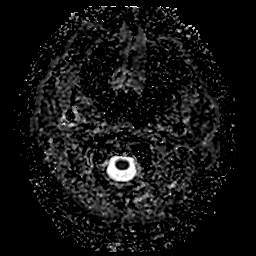
[im 17/49]
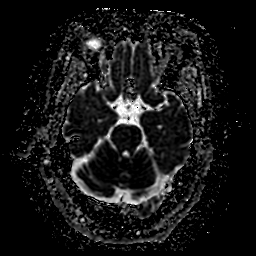
[im 33/49]
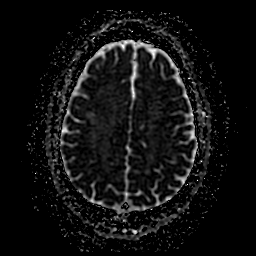
[im 49/49]
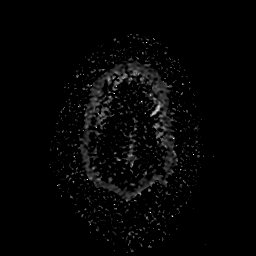

[Series 450: ADC · coronal · 4.0mm · 0.94mm/px · 3 of 36 slices shown (2 of 2)]
[im 1/36]
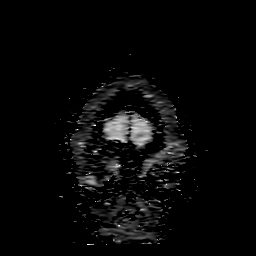
[im 18/36]
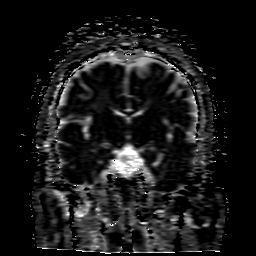
[im 36/36]
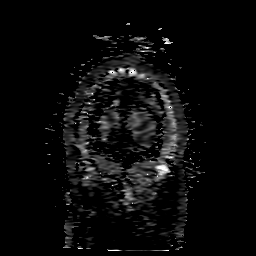

[36 of 48 positions shown; findings below may reference images not displayed]

FINDINGS: Brain: The midline structures are normal. There is no focal
diffusion restriction to indicate acute infarct. There is beginning
confluent hyperintense T2-weighted signal within the periventricular
and deep white matter, most often seen in the setting of chronic
microvascular ischemia. No intraparenchymal hematoma or chronic
microhemorrhage. Old right frontal lobe small infarct. Brain volume
is normal for age without age-advanced or lobar predominant atrophy.
The dura is normal and there is no extra-axial collection.

Vascular: Major intracranial arterial and venous sinus flow voids
are preserved.

Skull and upper cervical spine: The visualized skull base,
calvarium, upper cervical spine and extracranial soft tissues are
normal.

Sinuses/Orbits: No fluid levels or advanced mucosal thickening. No
mastoid or middle ear effusion. Normal orbits.
IMPRESSION: 1. No acute intracranial abnormality. No MR evidence of recent
seizure.
2. Moderate to severe sequelae of chronic microvascular ischemia.

## 2020-10-07 DIAGNOSIS — R079 Chest pain, unspecified: Secondary | ICD-10-CM | POA: Diagnosis not present

## 2020-10-08 DIAGNOSIS — R9430 Abnormal result of cardiovascular function study, unspecified: Secondary | ICD-10-CM | POA: Diagnosis not present

## 2020-10-08 DIAGNOSIS — E785 Hyperlipidemia, unspecified: Secondary | ICD-10-CM | POA: Diagnosis not present

## 2020-10-08 DIAGNOSIS — G40909 Epilepsy, unspecified, not intractable, without status epilepticus: Secondary | ICD-10-CM | POA: Diagnosis not present

## 2020-10-08 DIAGNOSIS — R079 Chest pain, unspecified: Secondary | ICD-10-CM | POA: Diagnosis not present

## 2020-10-17 DIAGNOSIS — I82409 Acute embolism and thrombosis of unspecified deep veins of unspecified lower extremity: Secondary | ICD-10-CM | POA: Insufficient documentation

## 2020-10-17 DIAGNOSIS — I2699 Other pulmonary embolism without acute cor pulmonale: Secondary | ICD-10-CM | POA: Insufficient documentation

## 2020-10-17 DIAGNOSIS — R569 Unspecified convulsions: Secondary | ICD-10-CM | POA: Insufficient documentation

## 2020-10-17 DIAGNOSIS — M109 Gout, unspecified: Secondary | ICD-10-CM | POA: Insufficient documentation

## 2020-10-19 ENCOUNTER — Encounter: Payer: Self-pay | Admitting: Cardiology

## 2020-10-19 ENCOUNTER — Ambulatory Visit (INDEPENDENT_AMBULATORY_CARE_PROVIDER_SITE_OTHER): Payer: Medicare Other | Admitting: Cardiology

## 2020-10-19 ENCOUNTER — Other Ambulatory Visit: Payer: Self-pay

## 2020-10-19 VITALS — BP 142/70 | HR 78 | Ht 67.0 in | Wt 194.6 lb

## 2020-10-19 DIAGNOSIS — E669 Obesity, unspecified: Secondary | ICD-10-CM

## 2020-10-19 DIAGNOSIS — I10 Essential (primary) hypertension: Secondary | ICD-10-CM | POA: Diagnosis not present

## 2020-10-19 DIAGNOSIS — E785 Hyperlipidemia, unspecified: Secondary | ICD-10-CM | POA: Diagnosis not present

## 2020-10-19 NOTE — Progress Notes (Signed)
Cardiology Office Note:    Date:  10/19/2020   ID:  Erik Peterson, DOB [birth year], MRN [id number]  PCP:  Pcp, No  Cardiologist:  Thomasene Ripple, DO  Electrophysiologist:  None   Referring MD: No ref. provider found   I am doing much better  History of Present Illness:    Erik Peterson is a 70 y.o. 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  The patient was recently admitted to [facility] for chest pain at which time he underwent a nuclear stress test which did not show any reversible ischemia as well as an echocardiogram which showed normal ejection fraction with diastolic dysfunction.  He is here today he says since he left the hospital he has not had any chest pain episodes.  He notes that he feels his chest pain was heartburn as he had had spicy food the night before he presented.  No shortness of breath no lightheadedness no dizziness.  Past Medical History:  Diagnosis Date  . DVT (deep venous thrombosis) (HCC)   . Gout   . Pulmonary embolism (HCC)   . Seizures (HCC)     Past Surgical History:  Procedure Laterality Date  . NO PAST SURGERIES      Current Medications: Current Meds  Medication Sig  . atorvastatin (LIPITOR) 40 MG tablet Take 1 tablet by mouth daily.  . cholecalciferol (VITAMIN D) 25 MCG (1000 UNIT) tablet Take 1 tablet by mouth daily.  Marland Kitchen ELIQUIS 5 MG TABS tablet Take 1 tablet by mouth 2 (two) times daily.  Marland Kitchen levETIRAcetam (KEPPRA) 750 MG tablet Take 750 mg by mouth at bedtime.   Marland Kitchen lisinopril (ZESTRIL) 10 MG tablet Take 1 tablet by mouth daily.     Allergies:   Patient has no known allergies.   Social History   Socioeconomic History  . Marital status: Legally Separated    Spouse name: Not on file  . Number of children: Not on file  . Years of education: Not on file  . Highest  education level: Not on file  Occupational History  . Occupation: retired from [employer]  . Smoking status: Never Smoker  . Smokeless tobacco: Never Used  Vaping Use  . Vaping Use: Never used  Substance and Sexual Activity  . Alcohol use: No  . Drug use: No  . Sexual activity: Not on file  Other Topics Concern  . Not on file  Social History Narrative  . Not on file   Social Determinants of Health   Financial Resource Strain: Not on file  Food Insecurity: Not on file  Transportation Needs: Not on file  Physical Activity: Not on file  Stress: Not on file  Social Connections: Not on file     Family History: The patient's family history is not on file.  ROS:   Review of Systems  Constitution: Negative for decreased appetite, fever and weight gain.  HENT: Negative for congestion, ear discharge, hoarse voice and sore throat.   Eyes: Negative for discharge, redness, vision loss in right eye and visual halos.  Cardiovascular: Negative for chest pain, dyspnea on exertion, leg swelling, orthopnea and palpitations.  Respiratory: Negative for cough, hemoptysis, shortness of breath and snoring.   Endocrine: Negative for heat intolerance and polyphagia.  Hematologic/Lymphatic: Negative for bleeding problem. Does not bruise/bleed easily.  Skin: Negative for flushing, nail  changes, rash and suspicious lesions.  Musculoskeletal: Negative for arthritis, joint pain, muscle cramps, myalgias, neck pain and stiffness.  Gastrointestinal: Negative for abdominal pain, bowel incontinence, diarrhea and excessive appetite.  Genitourinary: Negative for decreased libido, genital sores and incomplete emptying.  Neurological: Negative for brief paralysis, focal weakness, headaches and loss of balance.  Psychiatric/Behavioral: Negative for altered mental status, depression and suicidal ideas.  Allergic/Immunologic: Negative for HIV exposure and persistent infections.    EKGs/Labs/Other  Studies Reviewed:    The following studies were reviewed today:   EKG: None today  Chest x-ray done at [facility] October 07, 2020 showed low lung volumes with no active disease.  October 07, 2020 CTA of the chest negative for pulmonary embolus and or acute finding. Nuclear stress test done on October 08, 2020 no evidence of reversible ischemia.  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  Generalized hypokinesis.  Intermediate study. Echocardiogram done on October 08, 2020 overall left ventricle systolic function is normal.  55 to 60%.  Diastolic filling pattern indicates impaired relaxation.  Unable to exclude subtle regional wall motion abnormalities due to technical limitations.  Left atrium normal in size.  There is trace mitral regurgitation present.   Recent Labs: No results found for requested labs within last 8760 hours.  Recent Lipid Panel    Component Value Date/Time   CHOL 124 01/28/2017 0339   TRIG 28 01/28/2017 0339   HDL 41 01/28/2017 0339   CHOLHDL 3.0 01/28/2017 0339   VLDL 6 01/28/2017 0339   LDLCALC 77 01/28/2017 0339    Physical Exam:    VS:  BP (!) 142/70   Pulse 78   Ht 5\' 7"  (1.702 m)   Wt 194 lb 9.6 oz (88.3 kg)   SpO2 98%   BMI 30.48 kg/m     Wt Readings from Last 3 Encounters:  10/19/20 194 lb 9.6 oz (88.3 kg)  01/27/17 198 lb 3.1 oz (89.9 kg)     GEN: Well nourished, well developed in no acute distress HEENT: Normal NECK: No JVD; No carotid bruits LYMPHATICS: No lymphadenopathy CARDIAC: S1S2 noted,RRR, no murmurs, rubs, gallops RESPIRATORY:  Clear to auscultation without rales, wheezing or rhonchi  ABDOMEN: Soft, non-tender, non-distended, +bowel sounds, no guarding. EXTREMITIES: No edema, No cyanosis, no clubbing MUSCULOSKELETAL:  No deformity  SKIN: Warm and dry NEUROLOGIC:  Alert and oriented x 3, non-focal PSYCHIATRIC:  Normal affect, good insight  ASSESSMENT:    1. Hypertension,  unspecified type   2. Hyperlipidemia, unspecified hyperlipidemia type   3. Obesity (BMI 30-39.9)    PLAN:     1.  He appears to be doing well from a cardiovascular standpoint.  He denies any chest discomfort.  I reviewed his stress test with him in the office again today.  At this time no further testing as patient is asymptomatic.  2.  He is on lisinopril for his hypertension.  But tells me that he really does not need this medication unclear if he is going to continue this medicine as he did not admit either way.  3.  Hyperlipidemia - continue with current statin medication.  4.  This is being managed by his primary care doctor.  No adjustments for antidiabetic medications were made today.   5.  The patient understands the need to lose weight with diet and exercise. We have discussed specific strategies for this.  The patient is in agreement with the above plan. The patient left the  office in stable condition.  The patient will follow up in   Medication Adjustments/Labs and Tests Ordered: Current medicines are reviewed at length with the patient today.  Concerns regarding medicines are outlined above.  Orders Placed This Encounter  Procedures  . EKG 12-Lead   No orders of the defined types were placed in this encounter.   Patient Instructions  Medication Instructions:  Your physician recommends that you continue on your current medications as directed. Please refer to the Current Medication list given to you today.  *If you need a refill on your cardiac medications before your next appointment, please call your pharmacy*   Lab Work: None  If you have labs (blood work) drawn today and your tests are completely normal, you will receive your results only by: Marland Kitchen. MyChart Message (if you have MyChart) OR . A paper copy in the mail If you have any lab test that is abnormal or we need to change your treatment, we will call you to review the  results.   Testing/Procedures: None   Follow-Up: At [facility], you and your health needs are our priority.  As part of our continuing mission to provide you with exceptional heart care, we have created designated Provider Care Teams.  These Care Teams include your primary Cardiologist (physician) and Advanced Practice Providers (APPs -  Physician Assistants and Nurse Practitioners) who all work together to provide you with the care you need, when you need it.  We recommend signing up for the patient portal called "MyChart".  Sign up information is provided on this After Visit Summary.  MyChart is used to connect with patients for Virtual Visits (Telemedicine).  Patients are able to view lab/test results, encounter notes, upcoming appointments, etc.  Non-urgent messages can be sent to your provider as well.   To learn more about what you can do with MyChart, go to [email or web address].    Your next appointment:   6 month(s)  The format for your next appointment:   In Person  Provider:   Thomasene RippleKardie Ela Moffat, DO   Other Instructions      Adopting a Healthy Lifestyle.  Know what a healthy weight is for you (roughly BMI <25) and aim to maintain this   Aim for 7+ servings of fruits and vegetables daily   65-80+ fluid ounces of water or unsweet tea for healthy kidneys   Limit to max 1 drink of alcohol per day; avoid smoking/tobacco   Limit animal fats in diet for cholesterol and heart health - choose grass fed whenever available   Avoid highly processed foods, and foods high in saturated/trans fats   Aim for low stress - take time to unwind and care for your mental health   Aim for 150 min of moderate intensity exercise weekly for heart health, and weights twice weekly for bone health   Aim for 7-9 hours of sleep daily   When it comes to diets, agreement about the perfect plan isnt easy to find, even among the experts. Experts at the [employer]  developed an idea known as the Healthy Eating Plate. Just imagine a plate divided into logical, healthy portions.   The emphasis is on diet quality:   Load up on vegetables and fruits - one-half of your plate: Aim for color and variety, and remember that potatoes dont count.   Go for whole grains - one-quarter of your plate: Whole wheat, barley, wheat berries, quinoa, oats, brown rice, and foods made with them.  If you want pasta, go with whole wheat pasta.   Protein power - one-quarter of your plate: Fish, chicken, beans, and nuts are all healthy, versatile protein sources. Limit red meat.   The diet, however, does go beyond the plate, offering a few other suggestions.   Use healthy plant oils, such as olive, canola, soy, corn, sunflower and peanut. Check the labels, and avoid partially hydrogenated oil, which have unhealthy trans fats.   If youre thirsty, drink water. Coffee and tea are good in moderation, but skip sugary drinks and limit milk and dairy products to one or two daily servings.   The type of carbohydrate in the diet is more important than the amount. Some sources of carbohydrates, such as vegetables, fruits, whole grains, and beans-are healthier than others.   Finally, stay active  Signed, Thomasene Ripple, DO  10/19/2020 8:34 PM    [facility]

## 2020-10-19 NOTE — Patient Instructions (Signed)

## 2020-11-06 ENCOUNTER — Encounter: Payer: Self-pay | Admitting: *Deleted
